# Patient Record
Sex: Male | Born: 1970 | Race: White | Hispanic: No | Marital: Single | Smoking: Current every day smoker
Health system: Southern US, Community
[De-identification: ages and names within clinical notes are randomized; demographics above are authoritative.]

## PROBLEM LIST (undated history)

## (undated) DIAGNOSIS — K279 Peptic ulcer, site unspecified, unspecified as acute or chronic, without hemorrhage or perforation: Secondary | ICD-10-CM

## (undated) HISTORY — PX: APPENDECTOMY: SHX54

## (undated) HISTORY — PX: UPPER GASTROINTESTINAL ENDOSCOPY: SHX188

## (undated) HISTORY — DX: Peptic ulcer, site unspecified, unspecified as acute or chronic, without hemorrhage or perforation: K27.9

---

## 2004-12-11 ENCOUNTER — Emergency Department: Payer: Self-pay | Admitting: Emergency Medicine

## 2004-12-22 ENCOUNTER — Emergency Department: Payer: Self-pay | Admitting: Emergency Medicine

## 2005-10-22 ENCOUNTER — Emergency Department: Payer: Self-pay | Admitting: Internal Medicine

## 2005-10-28 ENCOUNTER — Emergency Department: Payer: Self-pay | Admitting: Unknown Physician Specialty

## 2005-11-08 ENCOUNTER — Emergency Department: Payer: Self-pay | Admitting: Emergency Medicine

## 2012-02-02 ENCOUNTER — Emergency Department: Payer: Self-pay | Admitting: Emergency Medicine

## 2012-02-02 LAB — URINALYSIS, COMPLETE
Bacteria: NONE SEEN
Bilirubin,UR: NEGATIVE
Glucose,UR: NEGATIVE mg/dL (ref 0–75)
Ketone: NEGATIVE
Leukocyte Esterase: NEGATIVE
Nitrite: NEGATIVE
Ph: 5 (ref 4.5–8.0)
RBC,UR: 4 /HPF (ref 0–5)
Squamous Epithelial: NONE SEEN

## 2012-02-02 LAB — CBC
HCT: 46 % (ref 40.0–52.0)
HGB: 15.8 g/dL (ref 13.0–18.0)
MCH: 31.9 pg (ref 26.0–34.0)
MCHC: 34.2 g/dL (ref 32.0–36.0)
MCV: 93 fL (ref 80–100)
Platelet: 229 10*3/uL (ref 150–440)
RDW: 13.7 % (ref 11.5–14.5)
WBC: 7.8 10*3/uL (ref 3.8–10.6)

## 2012-02-02 LAB — DRUG SCREEN, URINE
Barbiturates, Ur Screen: NEGATIVE (ref ?–200)
Benzodiazepine, Ur Scrn: NEGATIVE (ref ?–200)
Cannabinoid 50 Ng, Ur ~~LOC~~: POSITIVE (ref ?–50)
Methadone, Ur Screen: NEGATIVE (ref ?–300)
Opiate, Ur Screen: NEGATIVE (ref ?–300)
Phencyclidine (PCP) Ur S: NEGATIVE (ref ?–25)
Tricyclic, Ur Screen: NEGATIVE (ref ?–1000)

## 2012-02-02 LAB — COMPREHENSIVE METABOLIC PANEL
Albumin: 4.9 g/dL (ref 3.4–5.0)
Alkaline Phosphatase: 75 U/L (ref 50–136)
BUN: 20 mg/dL — ABNORMAL HIGH (ref 7–18)
Bilirubin,Total: 0.9 mg/dL (ref 0.2–1.0)
Chloride: 106 mmol/L (ref 98–107)
Co2: 30 mmol/L (ref 21–32)
Creatinine: 1.19 mg/dL (ref 0.60–1.30)
EGFR (African American): >60
EGFR (Non-African Amer.): >60
Glucose: 85 mg/dL (ref 65–99)
Osmolality: 283 (ref 275–301)
SGOT(AST): 24 U/L (ref 15–37)
SGPT (ALT): 31 U/L
Total Protein: 8.3 g/dL — ABNORMAL HIGH (ref 6.4–8.2)

## 2012-02-02 LAB — TSH: Thyroid Stimulating Horm: 0.92 u[IU]/mL

## 2012-02-02 LAB — ETHANOL: Ethanol: <3 mg/dL

## 2016-02-27 ENCOUNTER — Ambulatory Visit: Payer: Self-pay | Admitting: Family Medicine

## 2016-02-27 VITALS — BP 110/73 | HR 85 | Wt 137.0 lb

## 2016-02-27 DIAGNOSIS — M5412 Radiculopathy, cervical region: Secondary | ICD-10-CM

## 2016-02-27 DIAGNOSIS — Z833 Family history of diabetes mellitus: Secondary | ICD-10-CM

## 2016-02-27 DIAGNOSIS — K279 Peptic ulcer, site unspecified, unspecified as acute or chronic, without hemorrhage or perforation: Secondary | ICD-10-CM

## 2016-02-27 LAB — GLUCOSE, POCT (MANUAL RESULT ENTRY): POC Glucose: 110 mg/dl — AB (ref 70–99)

## 2016-02-27 MED ORDER — NAPROXEN 250 MG PO TABS: 500.0000 mg | ORAL_TABLET | Freq: Two times a day (BID) | ORAL | 3 refills | Status: DC

## 2016-02-27 MED ORDER — CYCLOBENZAPRINE HCL 10 MG PO TABS: 10.0000 mg | ORAL_TABLET | Freq: Three times a day (TID) | ORAL | 3 refills | Status: DC

## 2016-02-27 NOTE — Patient Instructions (Signed)
Follow up in 2 weeks Labs today 

## 2016-02-27 NOTE — Progress Notes (Signed)
   Subjective:    Patient ID: Tyrone Leon, male    DOB: 1971-03-13, 45 y.o.   MRN: [id number]  HPI Patient presents today with pain in neck, left shoulder, and upper back for the past 4-5 years. Pain rates usually 7-8 on a scale of 1-10. Patient grits his teeth due to pain.  Patient reports difficulty sleeping  due to pain.  Patient occasionally uses marijuana.   There are no active problems to display for this patient.     Medication List    as of 02/27/2016  6:47 PM   You have not been prescribed any medications.      Review of Systems  Constitutional: Negative.   Respiratory: Negative.   Cardiovascular: Negative.   Psychiatric/Behavioral: Negative.    Limited range of motion in neck- unable to rotate No scoliosis   Over 5/5 strength on right upper extremity Over 4 /5strength on left upper extremitiy  No leisons on throat   Objective:   Physical Exam  Constitutional: He is oriented to person, place, and time. He appears well-developed and well-nourished.  HENT:  Head: Normocephalic and atraumatic.  Eyes: Conjunctivae are normal.  Neck: No thyromegaly present.  Cardiovascular: Normal rate, regular rhythm and normal heart sounds.   Pulmonary/Chest: Effort normal and breath sounds normal.  Abdominal: Soft.  Lymphadenopathy:    He has no cervical adenopathy.  Neurological: He is alert and oriented to person, place, and time.  Skin: Skin is warm and dry.  Psychiatric: He has a normal mood and affect. His behavior is normal. Judgment and thought content normal.   BP 110/73   Pulse 85   Wt 137 lb (62.1 kg)         Assessment & Plan:  Possible radiculopathy Order: X ray of cervical vertebrae Visit diagnosis: cervical radiculopathy/ torticollis  Anti-inflammatory: naproxen and flexeril Follow up in 2 weeks Labs today: CBC, CMET, Lipid, TSH, PSA

## 2016-02-28 LAB — CBC WITH DIFFERENTIAL
BASOS: 1 %
Basophils Absolute: 0.1 10*3/uL (ref 0.0–0.2)
EOS (ABSOLUTE): 0.4 10*3/uL (ref 0.0–0.4)
Eos: 5 %
HEMOGLOBIN: 14.1 g/dL (ref 12.6–17.7)
Hematocrit: 41.5 % (ref 37.5–51.0)
IMMATURE GRANS (ABS): 0 10*3/uL (ref 0.0–0.1)
Immature Granulocytes: 0 %
Lymphocytes Absolute: 3.2 10*3/uL — ABNORMAL HIGH (ref 0.7–3.1)
Lymphs: 42 %
MCH: 30.8 pg (ref 26.6–33.0)
MCHC: 34 g/dL (ref 31.5–35.7)
MCV: 91 fL (ref 79–97)
Monocytes Absolute: 0.6 10*3/uL (ref 0.1–0.9)
Monocytes: 8 %
NEUTROS ABS: 3.3 10*3/uL (ref 1.4–7.0)
Neutrophils: 44 %
RBC: 4.58 x10E6/uL (ref 4.14–5.80)
RDW: 14 % (ref 12.3–15.4)
WBC: 7.7 10*3/uL (ref 3.4–10.8)

## 2016-02-28 LAB — COMPREHENSIVE METABOLIC PANEL
ALK PHOS: 64 IU/L (ref 39–117)
ALT: 10 IU/L (ref 0–44)
AST: 12 IU/L (ref 0–40)
Albumin/Globulin Ratio: 1.9 (ref 1.2–2.2)
Albumin: 4.8 g/dL (ref 3.5–5.5)
BUN/Creatinine Ratio: 20 (ref 9–20)
BUN: 21 mg/dL (ref 6–24)
Bilirubin Total: 0.4 mg/dL (ref 0.0–1.2)
CALCIUM: 9.4 mg/dL (ref 8.7–10.2)
CO2: 26 mmol/L (ref 18–29)
CREATININE: 1.04 mg/dL (ref 0.76–1.27)
Chloride: 99 mmol/L (ref 96–106)
GFR calc Af Amer: 101 mL/min/{1.73_m2} (ref >59)
GFR calc non Af Amer: 88 mL/min/{1.73_m2} (ref >59)
GLUCOSE: 95 mg/dL (ref 65–99)
Globulin, Total: 2.5 g/dL (ref 1.5–4.5)
Potassium: 4 mmol/L (ref 3.5–5.2)
Sodium: 143 mmol/L (ref 134–144)
Total Protein: 7.3 g/dL (ref 6.0–8.5)

## 2016-02-28 LAB — PSA: Prostate Specific Ag, Serum: 0.8 ng/mL (ref 0.0–4.0)

## 2016-02-28 LAB — LIPID PANEL
CHOLESTEROL TOTAL: 188 mg/dL (ref 100–199)
Chol/HDL Ratio: 4.7 ratio units (ref 0.0–5.0)
HDL: 40 mg/dL (ref >39)
LDL CALC: 101 mg/dL — AB (ref 0–99)
TRIGLYCERIDES: 235 mg/dL — AB (ref 0–149)
VLDL CHOLESTEROL CAL: 47 mg/dL — AB (ref 5–40)

## 2016-02-28 LAB — TSH: TSH: 0.648 u[IU]/mL (ref 0.450–4.500)

## 2016-03-05 ENCOUNTER — Ambulatory Visit
Admission: RE | Admit: 2016-03-05 | Discharge: 2016-03-05 | Disposition: A | Discharge status: Home / Self Care | Payer: Self-pay | Source: Ambulatory Visit | Attending: Urology | Admitting: Urology

## 2016-03-05 DIAGNOSIS — M5412 Radiculopathy, cervical region: Secondary | ICD-10-CM | POA: Insufficient documentation

## 2016-03-06 ENCOUNTER — Ambulatory Visit: Payer: Self-pay

## 2016-03-12 ENCOUNTER — Ambulatory Visit: Payer: Self-pay

## 2016-03-19 ENCOUNTER — Ambulatory Visit: Payer: Self-pay | Admitting: Nurse Practitioner

## 2016-03-19 VITALS — BP 113/77 | HR 75 | Wt 144.5 lb

## 2016-03-19 DIAGNOSIS — M436 Torticollis: Secondary | ICD-10-CM

## 2016-03-19 DIAGNOSIS — M2550 Pain in unspecified joint: Secondary | ICD-10-CM

## 2016-03-19 NOTE — Progress Notes (Unsigned)
C/o pain radiating from L shoulder to lower cervical spine,  Causes a tilt of head to L, describes as feeling as an ice pick.   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   X-ray from cervical spine was unremarkable, states he has not been able to work because of the pain.    Has gone from smoking 2 ppd to 1 can of spit tobacco per week.    Has very poor tolerance for prescription meds.  Flexeril causes prolonged sleep.   Exam:  In obvious pain, head tilted to left, limited mobility arms, R>L Pain with palpation at shoulder L arm unable to bring to shoulder level, extension, to approx 120 degrees, loss of grip strength.    Impression:   ? Bursitis   And pain, not likely c-spine related?    Will draw crp and look at RA panel  Will also refer to ortho for further eval and management  Will not prescribe any meds as he has such poor tolerance.  Will defer any prednisone.    Will defer any further imaging

## 2016-04-08 ENCOUNTER — Other Ambulatory Visit: Payer: Self-pay

## 2016-04-08 ENCOUNTER — Encounter: Payer: Self-pay | Admitting: Specialist

## 2016-04-08 ENCOUNTER — Ambulatory Visit: Payer: Self-pay | Admitting: Ophthalmology

## 2016-04-09 ENCOUNTER — Telehealth: Payer: Self-pay

## 2016-04-09 NOTE — Telephone Encounter (Signed)
Called pt to reschedule appt. Appts done.

## 2016-04-15 ENCOUNTER — Encounter: Payer: Self-pay | Admitting: Specialist

## 2016-04-22 ENCOUNTER — Other Ambulatory Visit: Payer: Self-pay

## 2016-04-22 ENCOUNTER — Ambulatory Visit: Payer: Self-pay | Admitting: Ophthalmology

## 2016-04-22 DIAGNOSIS — K279 Peptic ulcer, site unspecified, unspecified as acute or chronic, without hemorrhage or perforation: Secondary | ICD-10-CM

## 2016-04-23 LAB — C-REACTIVE PROTEIN: CRP: 0.6 mg/L (ref 0.0–4.9)

## 2016-04-23 LAB — RHEUMATOID FACTOR: Rhuematoid fact SerPl-aCnc: <10 IU/mL (ref 0.0–13.9)

## 2016-04-29 ENCOUNTER — Ambulatory Visit: Payer: Self-pay | Admitting: Internal Medicine

## 2016-05-07 ENCOUNTER — Ambulatory Visit: Payer: Self-pay | Admitting: Nurse Practitioner

## 2016-05-07 VITALS — BP 111/65 | HR 85 | Ht 70.0 in | Wt 152.0 lb

## 2016-05-07 DIAGNOSIS — R0981 Nasal congestion: Secondary | ICD-10-CM

## 2016-05-07 DIAGNOSIS — E782 Mixed hyperlipidemia: Secondary | ICD-10-CM

## 2016-05-07 DIAGNOSIS — J3489 Other specified disorders of nose and nasal sinuses: Secondary | ICD-10-CM

## 2016-05-07 DIAGNOSIS — R5383 Other fatigue: Secondary | ICD-10-CM

## 2016-05-07 MED ORDER — SALINE SPRAY 0.65 % NA SOLN: 2.0000 | NASAL | 11 refills | Status: DC | PRN

## 2016-05-07 MED ORDER — FLUTICASONE PROPIONATE 50 MCG/ACT NA SUSP: 2.0000 | Freq: Every day | NASAL | Status: AC

## 2016-05-07 NOTE — Progress Notes (Signed)
CHIEF COMPLAINT, NOSE IS ALWAYS STOPPED UP, CAN ONLY BREATH OUT OF HIS MOUTH  X 20 + YEARS, HAS NEVER TAKEN ANY PARTICULAR MEDS, UNKNOWN ETIOLOGY  HISTORY OF MULTIPLE BROKEN NOSE, HAS NOT HAD ANY NOSE SURGERIES     IMPRESSION/PLAN:  LIKELY DEVIATED SEPTUM, R/T MULTIPLE, UNREPAIRED NASAL FRACTURES  WILL IMPLEMENT FLUTICASONE AND SALINE NASAL SPRAYS TO TRY AND ALLOW SOME RELIEF  W[facility] RE[facility] FOR EVALUATION AND MANAGEMENT

## 2016-05-07 NOTE — Patient Instructions (Signed)
SEE ENT REFERRAL

## 2016-08-05 ENCOUNTER — Other Ambulatory Visit: Payer: Self-pay

## 2016-08-05 DIAGNOSIS — R5383 Other fatigue: Secondary | ICD-10-CM

## 2016-08-05 DIAGNOSIS — E782 Mixed hyperlipidemia: Secondary | ICD-10-CM

## 2016-08-06 LAB — CBC WITH DIFFERENTIAL/PLATELET
BASOS ABS: 0.1 10*3/uL (ref 0.0–0.2)
Basos: 2 %
EOS (ABSOLUTE): 0.3 10*3/uL (ref 0.0–0.4)
EOS: 5 %
HEMATOCRIT: 41.2 % (ref 37.5–51.0)
HEMOGLOBIN: 14.5 g/dL (ref 13.0–17.7)
IMMATURE GRANULOCYTES: 2 %
Immature Grans (Abs): 0.1 10*3/uL (ref 0.0–0.1)
LYMPHS: 49 %
Lymphocytes Absolute: 3 10*3/uL (ref 0.7–3.1)
MCH: 31.2 pg (ref 26.6–33.0)
MCHC: 35.2 g/dL (ref 31.5–35.7)
MCV: 89 fL (ref 79–97)
MONOCYTES: 9 %
Monocytes Absolute: 0.6 10*3/uL (ref 0.1–0.9)
Neutrophils Absolute: 2 10*3/uL (ref 1.4–7.0)
Neutrophils: 33 %
Platelets: 221 10*3/uL (ref 150–379)
RBC: 4.65 x10E6/uL (ref 4.14–5.80)
RDW: 14 % (ref 12.3–15.4)
WBC: 6 10*3/uL (ref 3.4–10.8)

## 2016-08-06 LAB — COMPREHENSIVE METABOLIC PANEL
ALBUMIN: 4.8 g/dL (ref 3.5–5.5)
ALK PHOS: 68 IU/L (ref 39–117)
ALT: 63 IU/L — ABNORMAL HIGH (ref 0–44)
AST: 71 IU/L — ABNORMAL HIGH (ref 0–40)
Albumin/Globulin Ratio: 1.7 (ref 1.2–2.2)
BUN / CREAT RATIO: 13 (ref 9–20)
BUN: 14 mg/dL (ref 6–24)
Bilirubin Total: 0.5 mg/dL (ref 0.0–1.2)
CALCIUM: 9.3 mg/dL (ref 8.7–10.2)
CO2: 23 mmol/L (ref 18–29)
CREATININE: 1.11 mg/dL (ref 0.76–1.27)
Chloride: 103 mmol/L (ref 96–106)
GFR calc Af Amer: 94 mL/min/{1.73_m2} (ref >59)
GFR, EST NON AFRICAN AMERICAN: 81 mL/min/{1.73_m2} (ref >59)
GLUCOSE: 90 mg/dL (ref 65–99)
Globulin, Total: 2.9 g/dL (ref 1.5–4.5)
Potassium: 5.3 mmol/L — ABNORMAL HIGH (ref 3.5–5.2)
Sodium: 145 mmol/L — ABNORMAL HIGH (ref 134–144)
Total Protein: 7.7 g/dL (ref 6.0–8.5)

## 2016-08-06 LAB — LIPID PANEL
CHOLESTEROL TOTAL: 244 mg/dL — AB (ref 100–199)
Chol/HDL Ratio: 4.4 ratio units (ref 0.0–5.0)
HDL: 55 mg/dL (ref >39)
LDL CALC: 155 mg/dL — AB (ref 0–99)
Triglycerides: 172 mg/dL — ABNORMAL HIGH (ref 0–149)
VLDL CHOLESTEROL CAL: 34 mg/dL (ref 5–40)

## 2016-08-06 LAB — TSH: TSH: 1.75 u[IU]/mL (ref 0.450–4.500)

## 2016-08-12 ENCOUNTER — Ambulatory Visit: Payer: Self-pay | Admitting: Internal Medicine

## 2016-08-21 ENCOUNTER — Other Ambulatory Visit: Payer: Self-pay | Admitting: Urology

## 2016-08-21 ENCOUNTER — Telehealth: Payer: Self-pay

## 2016-08-21 DIAGNOSIS — R748 Abnormal levels of other serum enzymes: Secondary | ICD-10-CM

## 2016-08-21 NOTE — Telephone Encounter (Signed)
-----   Message from Harle BattiestShannon A McGowan, PA-C sent at 08/20/2016 10:11 AM EST ----- Patient's liver enzymes are significantly elevated from 5 months ago.  Does he drink alcohol?  If so,  I suggest he abstain and recheck his LFT's in one months.  If he is not drinking alcohol,  I suggest an ultrasound of his liver.

## 2016-08-21 NOTE — Telephone Encounter (Signed)
Called pt and left message about the ultrasound. They should call him and make that appt if not he should call me back next week sometime.

## 2016-08-21 NOTE — Telephone Encounter (Signed)
-----   Message from Tyrone BattiestShannon A McGowan, PA-C sent at 08/21/2016  2:44 PM EST ----- I have put the orders in for an ultrasound.   ----- Message ----- From: Audelia HivesAmanda E Ector Laurel, CMA Sent: 08/21/2016  10:21 AM To: Tyrone BattiestShannon A McGowan, PA-C  Shannon, Pt stated he rarely drinks alcohol so I think the ultrasound is what you wanted to do next. If you could put that order in for me I will handle that for you. Pt does have an appt on 01/31 to see chaplin.  ----- Message ----- From: Tyrone BattiestShannon A McGowan, PA-C Sent: 08/20/2016  10:11 AM To: Audelia HivesAmanda E Kaelem Brach, CMA  Patient's liver enzymes are significantly elevated from 5 months ago.  Does he drink alcohol?  If so,  I suggest he abstain and recheck his LFT's in one months.  If he is not drinking alcohol,  I suggest an ultrasound of his liver.

## 2016-08-21 NOTE — Telephone Encounter (Signed)
Called pt to go over results. PT stated he does not drink alcohol, every now and then. So shannon suggested a ultrasound of liver. I will have her put that order in and call pt when I have sent the referral over. Pt verbalized understanding.

## 2016-08-28 ENCOUNTER — Ambulatory Visit
Admission: RE | Admit: 2016-08-28 | Discharge: 2016-08-28 | Disposition: A | Discharge status: Home / Self Care | Payer: Self-pay | Source: Ambulatory Visit | Attending: Urology | Admitting: Urology

## 2016-08-28 DIAGNOSIS — K824 Cholesterolosis of gallbladder: Secondary | ICD-10-CM | POA: Insufficient documentation

## 2016-08-28 DIAGNOSIS — R748 Abnormal levels of other serum enzymes: Secondary | ICD-10-CM | POA: Insufficient documentation

## 2016-09-02 ENCOUNTER — Encounter: Payer: Self-pay | Admitting: Internal Medicine

## 2016-09-02 ENCOUNTER — Ambulatory Visit: Payer: Self-pay | Admitting: Internal Medicine

## 2016-09-02 VITALS — BP 114/76 | HR 79 | Temp 98.1°F | Wt 163.0 lb

## 2016-09-02 DIAGNOSIS — R748 Abnormal levels of other serum enzymes: Secondary | ICD-10-CM

## 2016-09-02 DIAGNOSIS — Z833 Family history of diabetes mellitus: Secondary | ICD-10-CM

## 2016-09-02 LAB — GLUCOSE, POCT (MANUAL RESULT ENTRY): POC Glucose: 118 mg/dl — AB (ref 70–99)

## 2016-09-02 NOTE — Progress Notes (Signed)
   Subjective:    Patient ID: Tyrone Leon, male    DOB: 03/05/1971, 46 y.o.   MRN: [id number]  HPI   Pt f/u for US Abdomen result for elevated liver enzymes.  Pt complains of constant shoulder pain.   Patient Active Problem List   Diagnosis Date Noted  . PUD (peptic ulcer disease)    Allergies as of 09/02/2016   Not on File     Medication List       Accurate as of 09/02/16  9:19 AM. Always use your most recent med list.          cyclobenzaprine 10 MG tablet Commonly known as:  FLEXERIL Take 1 tablet (10 mg total) by mouth 3 (three) times daily.   naproxen 250 MG tablet Commonly known as:  NAPROSYN Take 2 tablets (500 mg total) by mouth 2 (two) times daily with a meal.         Review of Systems  US Abdomen results are normal. Recommended f/u in 5 years.    Objective:   Physical Exam  Constitutional: He is oriented to person, place, and time.  Cardiovascular: Normal rate, regular rhythm and normal heart sounds.   Pulmonary/Chest: Effort normal and breath sounds normal.  Neurological: He is alert and oriented to person, place, and time.    BP 114/76   Pulse 79   Temp 98.1 F (36.7 C) (Oral)   Wt 163 lb (73.9 kg)   BMI 23.39 kg/m        Assessment & Plan:   Labs today: Hep C screen, liver panel Print copy of labs and ultrasound report

## 2016-09-02 NOTE — Patient Instructions (Addendum)
Labs today  [facility]

## 2016-09-03 LAB — HEPATIC FUNCTION PANEL
ALK PHOS: 68 IU/L (ref 39–117)
ALT: 13 IU/L (ref 0–44)
AST: 14 IU/L (ref 0–40)
Albumin: 4.8 g/dL (ref 3.5–5.5)
BILIRUBIN TOTAL: 0.5 mg/dL (ref 0.0–1.2)
BILIRUBIN, DIRECT: 0.12 mg/dL (ref 0.00–0.40)
Total Protein: 7.3 g/dL (ref 6.0–8.5)

## 2016-09-03 LAB — HEPATITIS C ANTIBODY: HEP C VIRUS AB: 0.1 {s_co_ratio} (ref 0.0–0.9)

## 2016-09-17 ENCOUNTER — Telehealth: Payer: Self-pay

## 2016-09-17 NOTE — Telephone Encounter (Signed)
Called pt with results. PT verbalized understanding. 

## 2016-10-29 ENCOUNTER — Ambulatory Visit: Payer: Self-pay | Admitting: Adult Health Nurse Practitioner

## 2016-10-29 VITALS — BP 108/69 | HR 88 | Temp 98.8°F | Wt 159.4 lb

## 2016-10-29 DIAGNOSIS — K279 Peptic ulcer, site unspecified, unspecified as acute or chronic, without hemorrhage or perforation: Secondary | ICD-10-CM

## 2016-10-29 DIAGNOSIS — K633 Ulcer of intestine: Secondary | ICD-10-CM

## 2016-10-29 MED ORDER — PANTOPRAZOLE SODIUM 40 MG PO TBEC: 40.0000 mg | DELAYED_RELEASE_TABLET | Freq: Every day | ORAL | 1 refills | Status: AC

## 2016-10-29 NOTE — Progress Notes (Signed)
  Patient: Tyrone Leon Male    DOB: 05/31/1971   46 y.o.   MRN: [id number] Visit Date: 10/29/2016  Today's Provider: Jacelyn Pieah Doles-Johnson, NP   Chief Complaint  Patient presents with  . GI Problem    stomach ulcers, sharp pain upper stomach,  . Diarrhea    consistent for 6 months, change in color  . Joint Pain    full body joint pain   Subjective:    HPI  Pt states that he feels like his ulcers have been "acting up" over the past few months.  Abdominal pain, sharp cramps green or black BMs- no noted blood. Loose stool.  Pt states that they were found using endoscopy.  Not on any medications at this time.  States that he has been treating with milk.  Denies nausea or vomiting.     Not on File Previous Medications   CYCLOBENZAPRINE (FLEXERIL) 10 MG TABLET    Take 1 tablet (10 mg total) by mouth 3 (three) times daily.   NAPROXEN (NAPROSYN) 250 MG TABLET    Take 2 tablets (500 mg total) by mouth 2 (two) times daily with a meal.    Review of Systems  All other systems reviewed and are negative.   Social History  Substance Use Topics  . Smoking status: Current Every Day Smoker    Packs/day: 1.00    Years: 25.00    Types: Cigarettes    Last attempt to quit: 2017  . Smokeless tobacco: Current User    Types: Chew     Comment: quit cigarettes about 8 mo ago   . Alcohol use 1.8 - 2.4 oz/week    3 - 4 Cans of beer per week     Comment: 3-4 beers per year   Objective:   BP 108/69   Pulse 88   Temp 98.8 F (37.1 C)   Wt 159 lb 6.4 oz (72.3 kg)   BMI 22.87 kg/m   Physical Exam  Constitutional: He is oriented to person, place, and time. He appears well-developed and well-nourished.  HENT:  Head: Normocephalic and atraumatic.  Cardiovascular: Normal rate, regular rhythm and normal heart sounds.   Pulmonary/Chest: Effort normal and breath sounds normal.  Abdominal: Soft. Bowel sounds are normal. He exhibits no distension and no mass. There is tenderness in the epigastric  area. There is no rebound and no guarding.  Neurological: He is alert and oriented to person, place, and time.  Skin: Skin is warm and dry.  Vitals reviewed.       Assessment & Plan:         Ulcer:  H. Pylori testing.  Stool occult blood testing.  Start Pantoprazole 40mg  daily.  Avoid triggers.  Check CBC for stability.     Jacelyn Pieah Doles-Johnson, NP   [facility]

## 2016-10-29 NOTE — Addendum Note (Signed)
Addended by: Dustin FlockMAYNOR, Brittny Spangle D on: 10/29/2016 07:03 PM   Modules accepted: Orders

## 2016-10-29 NOTE — Addendum Note (Signed)
Addended by: Charlene BrookeLES-JOHNSON, Amyre Segundo N on: 10/29/2016 07:06 PM   Modules accepted: Orders

## 2016-10-29 NOTE — Addendum Note (Signed)
Addended by: Dustin FlockMAYNOR, Neveah Bang D on: 10/29/2016 06:50 PM   Modules accepted: Orders

## 2016-10-30 LAB — CBC
HEMATOCRIT: 42 % (ref 37.5–51.0)
Hemoglobin: 14.3 g/dL (ref 13.0–17.7)
MCH: 31.2 pg (ref 26.6–33.0)
MCHC: 34 g/dL (ref 31.5–35.7)
MCV: 92 fL (ref 79–97)
Platelets: 263 10*3/uL (ref 150–379)
RBC: 4.58 x10E6/uL (ref 4.14–5.80)
RDW: 13.8 % (ref 12.3–15.4)
WBC: 8.1 10*3/uL (ref 3.4–10.8)

## 2016-11-01 LAB — H. PYLORI BREATH TEST

## 2016-11-01 LAB — H.PYLORI BREATH TEST (REFLEX): H. PYLORI BREATH TEST: NEGATIVE

## 2016-12-23 ENCOUNTER — Ambulatory Visit: Payer: Self-pay | Admitting: Internal Medicine

## 2016-12-23 VITALS — BP 99/63 | HR 80 | Temp 97.9°F | Wt 161.2 lb

## 2016-12-23 DIAGNOSIS — F17210 Nicotine dependence, cigarettes, uncomplicated: Secondary | ICD-10-CM

## 2016-12-23 NOTE — Patient Instructions (Signed)
Labs today Referral for chiropractic evaluation for neck and back pain [facility] for [facility]

## 2016-12-23 NOTE — Progress Notes (Signed)
   Subjective:    Patient ID: Tyrone Leon, male    DOB: 07/22/1971, 46 y.o.   MRN: [id number]  HPI   Pt here for regular apt  Pt would like to quit smoking.  Pt reports chronic back and neck pain. Pt has seen a rheumatologist and neurologist for pain and has had xrays with no relief.   Patient Active Problem List   Diagnosis Date Noted  . PUD (peptic ulcer disease)    Allergies as of 12/23/2016   Not on File     Medication List       Accurate as of 12/23/16 11:33 AM. Always use your most recent med list.          cyclobenzaprine 10 MG tablet Commonly known as:  FLEXERIL Take 1 tablet (10 mg total) by mouth 3 (three) times daily.   naproxen 250 MG tablet Commonly known as:  NAPROSYN Take 2 tablets (500 mg total) by mouth 2 (two) times daily with a meal.   pantoprazole 40 MG tablet Commonly known as:  PROTONIX Take 1 tablet (40 mg total) by mouth daily.        Review of Systems     Objective:   Physical Exam  Constitutional: He is oriented to person, place, and time.  Cardiovascular: Normal rate, regular rhythm and normal heart sounds.   Pulmonary/Chest: Effort normal and breath sounds normal.  Neurological: He is alert and oriented to person, place, and time.    BP 99/63   Pulse 80   Temp 97.9 F (36.6 C)   Wt 161 lb 3.2 oz (73.1 kg)   BMI 23.13 kg/m   Smoking cessation counseling given to pt.      Assessment & Plan:   Labs today: Met C, CBC, UA, Lipid, TSH, A1C Referral for chiropractic evaluation for neck and back pain Referral to [facility] for smoking cessation class   

## 2016-12-24 LAB — LIPID PANEL
Chol/HDL Ratio: 4.5 ratio (ref 0.0–5.0)
Cholesterol, Total: 180 mg/dL (ref 100–199)
HDL: 40 mg/dL (ref >39)
LDL Calculated: 117 mg/dL — ABNORMAL HIGH (ref 0–99)
Triglycerides: 116 mg/dL (ref 0–149)
VLDL Cholesterol Cal: 23 mg/dL (ref 5–40)

## 2016-12-24 LAB — COMPREHENSIVE METABOLIC PANEL
ALBUMIN: 4.7 g/dL (ref 3.5–5.5)
ALT: 11 IU/L (ref 0–44)
AST: 17 IU/L (ref 0–40)
Albumin/Globulin Ratio: 1.9 (ref 1.2–2.2)
Alkaline Phosphatase: 65 IU/L (ref 39–117)
BUN / CREAT RATIO: 11 (ref 9–20)
BUN: 11 mg/dL (ref 6–24)
Bilirubin Total: 0.4 mg/dL (ref 0.0–1.2)
CALCIUM: 9.2 mg/dL (ref 8.7–10.2)
CO2: 26 mmol/L (ref 18–29)
CREATININE: 1.03 mg/dL (ref 0.76–1.27)
Chloride: 103 mmol/L (ref 96–106)
GFR, EST AFRICAN AMERICAN: 102 mL/min/{1.73_m2} (ref >59)
GFR, EST NON AFRICAN AMERICAN: 88 mL/min/{1.73_m2} (ref >59)
GLOBULIN, TOTAL: 2.5 g/dL (ref 1.5–4.5)
Glucose: 90 mg/dL (ref 65–99)
Potassium: 4 mmol/L (ref 3.5–5.2)
SODIUM: 141 mmol/L (ref 134–144)
TOTAL PROTEIN: 7.2 g/dL (ref 6.0–8.5)

## 2016-12-24 LAB — URINALYSIS
BILIRUBIN UA: NEGATIVE
Glucose, UA: NEGATIVE
Ketones, UA: NEGATIVE
Leukocytes, UA: NEGATIVE
Nitrite, UA: NEGATIVE
PROTEIN UA: NEGATIVE
RBC, UA: NEGATIVE
Specific Gravity, UA: 1.007 (ref 1.005–1.030)
Urobilinogen, Ur: 0.2 mg/dL (ref 0.2–1.0)
pH, UA: 5.5 (ref 5.0–7.5)

## 2016-12-24 LAB — HEMOGLOBIN A1C
Est. average glucose Bld gHb Est-mCnc: 114 mg/dL
HEMOGLOBIN A1C: 5.6 % (ref 4.8–5.6)

## 2016-12-24 LAB — CBC
HEMATOCRIT: 43.7 % (ref 37.5–51.0)
Hemoglobin: 14.7 g/dL (ref 13.0–17.7)
MCH: 30.8 pg (ref 26.6–33.0)
MCHC: 33.6 g/dL (ref 31.5–35.7)
MCV: 91 fL (ref 79–97)
Platelets: 246 10*3/uL (ref 150–379)
RBC: 4.78 x10E6/uL (ref 4.14–5.80)
RDW: 13.6 % (ref 12.3–15.4)
WBC: 5.8 10*3/uL (ref 3.4–10.8)

## 2016-12-24 LAB — TSH: TSH: 1.2 u[IU]/mL (ref 0.450–4.500)

## 2017-01-06 ENCOUNTER — Ambulatory Visit: Payer: Self-pay | Admitting: Chiropractor

## 2017-01-06 ENCOUNTER — Encounter: Payer: Self-pay | Admitting: Chiropractor

## 2017-01-06 DIAGNOSIS — M9901 Segmental and somatic dysfunction of cervical region: Secondary | ICD-10-CM

## 2017-01-06 DIAGNOSIS — M9902 Segmental and somatic dysfunction of thoracic region: Secondary | ICD-10-CM | POA: Insufficient documentation

## 2017-01-06 DIAGNOSIS — M9903 Segmental and somatic dysfunction of lumbar region: Secondary | ICD-10-CM | POA: Insufficient documentation

## 2017-01-06 DIAGNOSIS — M62838 Other muscle spasm: Secondary | ICD-10-CM | POA: Insufficient documentation

## 2017-01-06 NOTE — Progress Notes (Signed)
S: Patient presents with right upper back pain under the scapular. Patient states its been off and on for the last 13 years with little relief. Progressively getting worse. 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 Nothing makes it better. Position, exercise, and sleeping makes it worse. Pain is achy and dull, sometimes sharp. Pain radiates up into the neck causing neck pain. Pain is 8/10. Worse in the morning. Pain is constant. Patient only sleeps 2-4 hours each day due to pain. Headaches when pain is at its worst.   Patient also presents with low back pain. Pain is not constant but 3 times in the last 2 years he has had sudden sharp pain in his low back and paralysis from the waist down. Lasts about 45 minutes. First episode two years ago after shoveling. Next episode after picking up a 2lb box. Last episode after getting up from couch and turning. Patient has been seen by many specialists. MRIs and CTs have revealed nothing. MDs' best guess is something neurological. No loss of bowel or bladder control.   Patient's main goal is to get back in the gym working out.  O:  Posture - Left Csp lateral flexion, left high shoulder, right high hip, anterior head carriage.  ROM: Decreased AROM of cervical spine in all directions. Patients states mostly from stiffness. Lsp ROM WNL except for decreased extension with pain.  Segmental dysfunction: C2 L, C5 R, T3R, T5L, T9 R, L2 R, L5 R, SI R PI.   TTF: Csp B, Tsp B, Lsp R  Ortho: Positive max foraminal compression left, jacksons bilateral, distraction bilateral, yeomans bilateral, hibbs bilateral. Negative Lsp and Csp valsalva, SLR, braggards.  Neuro: Reflexes C5-T1 and L4-S1 2, Motor C5-T1 and L4-S1 5, Sensory C5-T1 and L4-S1 normal bilateral.  A: Patient's initial visit. Chronic phase, 1x EOW until next re-eval.  P: SMT at above stated levels. Patient received care without incident. Patient stated he felt much better after Tx. ROF completed  before Tx. PARNB.

## 2017-01-20 ENCOUNTER — Ambulatory Visit: Payer: Self-pay | Admitting: Chiropractor

## 2017-01-20 DIAGNOSIS — M9902 Segmental and somatic dysfunction of thoracic region: Secondary | ICD-10-CM

## 2017-01-20 DIAGNOSIS — M9901 Segmental and somatic dysfunction of cervical region: Secondary | ICD-10-CM

## 2017-01-20 DIAGNOSIS — M9903 Segmental and somatic dysfunction of lumbar region: Secondary | ICD-10-CM

## 2017-01-20 DIAGNOSIS — M62838 Other muscle spasm: Secondary | ICD-10-CM

## 2017-01-20 NOTE — Progress Notes (Signed)
S: Patient states he feels much better since LPV. Patient presents with right upper back pain under the scapular. Patient states its been off and on for the last 13 years with little relief. Progressively getting worse. 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 Nothing makes it better. Position, exercise, and sleeping makes it worse. Pain is achy and dull, sometimes sharp. Pain radiates up into the neck causing neck pain. Pain is 3/10. Worse in the morning. Pain is constant. Patient only sleeps 2-4 hours each day due to pain. Headaches when pain is at its worst. Patient states he was able to get back into the gym.  Patient also presents with low back pain. Pain is not constant but 3 times in the last 2 years he has had sudden sharp pain in his low back and paralysis from the waist down. Lasts about 45 minutes. First episode two years ago after shoveling. Next episode after picking up a 2lb box. Last episode after getting up from couch and turning. Patient has been seen by many specialists. MRIs and CTs have revealed nothing. MDs' best guess is something neurological. No loss of bowel or bladder control.    O:  Posture - Left Csp lateral flexion, left high shoulder, right high hip, anterior head carriage.  ROM: Decreased AROM of cervical spine in all directions. Patients states mostly from stiffness. Lsp ROM WNL except for decreased extension with pain.  Segmental dysfunction: C2 L, C5 R, T3R, T5L, T9 R, L2 R, L5 R, SI R PI.   TTF: Csp B, Tsp B, Lsp R  Ortho: Positive max foraminal compression left, jacksons bilateral, distraction bilateral, yeomans bilateral, hibbs bilateral. Negative Lsp and Csp valsalva, SLR, braggards.  Neuro: Reflexes C5-T1 and L4-S1 2, Motor C5-T1 and L4-S1 5, Sensory C5-T1 and L4-S1 normal bilateral.  A:  Chronic phase, 1x EOW until next re-eval.  P: SMT at above stated levels. Patient received care without incident. Patient stated he felt much better after Tx.

## 2017-02-17 ENCOUNTER — Ambulatory Visit: Payer: Self-pay | Admitting: Chiropractor

## 2017-02-17 DIAGNOSIS — M9902 Segmental and somatic dysfunction of thoracic region: Secondary | ICD-10-CM

## 2017-02-17 DIAGNOSIS — M9901 Segmental and somatic dysfunction of cervical region: Secondary | ICD-10-CM

## 2017-02-17 DIAGNOSIS — M62838 Other muscle spasm: Secondary | ICD-10-CM

## 2017-02-17 DIAGNOSIS — M9903 Segmental and somatic dysfunction of lumbar region: Secondary | ICD-10-CM

## 2017-02-17 NOTE — Progress Notes (Signed)
S: Patient states he feels much better since LPV, however his right upper back gave him some trouble Monday. Patient took aleve which helped. Patient presents with right upper back pain under the scapular. Patient states its been off and on for the last 13 years with little relief. Progressively getting worse. 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 Nothing makes it better. Position, exercise, and sleeping makes it worse. Pain is achy and dull, sometimes sharp. Pain radiates up into the neck causing neck pain. Pain is 3/10. Worse in the morning. Pain is constant. Patient only sleeps 2-4 hours each day due to pain. Headaches when pain is at its worst. Patient states he was able to get back into the gym.  Patient also presents with low back pain. Pain is not constant but 3 times in the last 2 years he has had sudden sharp pain in his low back and paralysis from the waist down. Lasts about 45 minutes. First episode two years ago after shoveling. Next episode after picking up a 2lb box. Last episode after getting up from couch and turning. Patient has been seen by many specialists. MRIs and CTs have revealed nothing. MDs' best guess is something neurological. No loss of bowel or bladder control.    O:  Posture - Left Csp lateral flexion, left high shoulder, right high hip, anterior head carriage.  ROM: Decreased AROM of cervical spine in all directions. Patients states mostly from stiffness. Lsp ROM WNL except for decreased extension with pain.  Segmental dysfunction: C2 L, C5 R, T3R, T5L, T9 R, L2 R, L5 R, SI R PI.   TTF: Csp B, Tsp B, Lsp R  Ortho: Positive max foraminal compression left, jacksons bilateral, distraction bilateral, yeomans bilateral, hibbs bilateral. Negative Lsp and Csp valsalva, SLR, braggards.  Neuro: Reflexes C5-T1 and L4-S1 2, Motor C5-T1 and L4-S1 5, Sensory C5-T1 and L4-S1 normal bilateral.  A:  Chronic phase, 1x EOW until next re-eval.  P: SMT at above stated  levels. Patient received care without incident. Patient stated he felt much better after Tx.

## 2017-03-17 ENCOUNTER — Ambulatory Visit: Payer: Self-pay | Admitting: Chiropractor

## 2017-03-17 DIAGNOSIS — M9903 Segmental and somatic dysfunction of lumbar region: Secondary | ICD-10-CM

## 2017-03-17 DIAGNOSIS — M9902 Segmental and somatic dysfunction of thoracic region: Secondary | ICD-10-CM

## 2017-03-17 DIAGNOSIS — M62838 Other muscle spasm: Secondary | ICD-10-CM

## 2017-03-17 DIAGNOSIS — M9901 Segmental and somatic dysfunction of cervical region: Secondary | ICD-10-CM

## 2017-03-17 NOTE — Progress Notes (Signed)
S: Patient states he has felt a lot worse in the last 48 hours. Woke up with right lower back pain two mornings ago and felt his neck get very stiff last night. Patient presents with right upper back pain under the scapular. Patient states its been off and on for the last 13 years with little relief. Progressively getting worse. 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 Nothing makes it better. Position, exercise, and sleeping makes it worse. Pain is achy and dull, sometimes sharp. Pain radiates up into the neck causing neck pain. Pain is 6/10. Worse in the morning. Pain is constant. Patient only sleeps 2-4 hours each day due to pain. Headaches when pain is at its worst. Patient states he was able to get back into the gym.  Patient also presents with low back pain. Pain is not constant but 3 times in the last 2 years he has had sudden sharp pain in his low back and paralysis from the waist down. Lasts about 45 minutes. First episode two years ago after shoveling. Next episode after picking up a 2lb box. Last episode after getting up from couch and turning. Patient has been seen by many specialists. MRIs and CTs have revealed nothing. MDs' best guess is something neurological. No loss of bowel or bladder control.    O:  Posture - Left Csp lateral flexion, left high shoulder, right high hip, anterior head carriage.  ROM: Decreased AROM of cervical spine in all directions. Patients states mostly from stiffness. Lsp ROM WNL except for decreased extension with pain.  Segmental dysfunction: C2 L, C5 R, T3R, T5L, T9 R, L2 R, L5 R, SI R PI.   TTF: Csp B, Tsp B, Lsp R  Ortho: Positive max foraminal compression left, jacksons bilateral, distraction bilateral, yeomans bilateral, hibbs bilateral. Negative Lsp and Csp valsalva, SLR, braggards.  Neuro: Reflexes C5-T1 and L4-S1 2, Motor C5-T1 and L4-S1 5, Sensory C5-T1 and L4-S1 normal bilateral.  A:  Chronic phase, 1x EOW until next re-eval.  P: SMT  at above stated levels. Patient received care without incident. Patient stated he felt much better after Tx.

## 2017-03-31 ENCOUNTER — Ambulatory Visit: Payer: Self-pay | Admitting: Chiropractor

## 2017-03-31 DIAGNOSIS — M62838 Other muscle spasm: Secondary | ICD-10-CM

## 2017-03-31 DIAGNOSIS — M9902 Segmental and somatic dysfunction of thoracic region: Secondary | ICD-10-CM

## 2017-03-31 DIAGNOSIS — M9903 Segmental and somatic dysfunction of lumbar region: Secondary | ICD-10-CM

## 2017-03-31 DIAGNOSIS — M9901 Segmental and somatic dysfunction of cervical region: Secondary | ICD-10-CM

## 2017-03-31 NOTE — Progress Notes (Signed)
S: Patient states he has felt a lot better since LPV. Patient states he is in the gym every day working out, could potential cause his recent tightness. Patient presents with right upper back pain under the scapula. Patient states its been off and on for the last 13 years with little relief. 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 Nothing makes it better. Position, exercise, and sleeping makes it worse. Pain is achy and dull, sometimes sharp. Pain radiates up into the neck causing neck pain. Pain is 4/10. Worse in the morning. Pain is constant. Patient only sleeps 2-4 hours each day due to pain. Headaches when pain is at its worst. Patient states he was able to get back into the gym.  Patient also presents with low back pain. Pain is not constant but 3 times in the last 2 years he has had sudden sharp pain in his low back and paralysis from the waist down. Lasts about 45 minutes. First episode two years ago after shoveling. Next episode after picking up a 2lb box. Last episode after getting up from couch and turning. Patient has been seen by many specialists. MRIs and CTs have revealed nothing. MDs' best guess is something neurological. No loss of bowel or bladder control.    O:  Posture - Left Csp lateral flexion, left high shoulder, right high hip, anterior head carriage.  ROM: Decreased AROM of cervical spine in all directions. Patients states mostly from stiffness. Lsp ROM WNL except for decreased extension with pain.  Segmental dysfunction: C2 L, C5 R, T3R, T5L, T9 R, L2 R, L5 R, SI R PI.   TTF: Csp B, Tsp B, Lsp R  Ortho: Positive max foraminal compression left, jacksons bilateral, distraction bilateral, yeomans bilateral, hibbs bilateral. Negative Lsp and Csp valsalva, SLR, braggards.  Neuro: Reflexes C5-T1 and L4-S1 2, Motor C5-T1 and L4-S1 5, Sensory C5-T1 and L4-S1 normal bilateral.  A:  Chronic phase, 1x EOW until next re-eval.  P: SMT at above stated levels. Patient  received care without incident. Patient stated he felt much better after Tx.

## 2017-04-14 ENCOUNTER — Ambulatory Visit: Payer: Self-pay | Admitting: Chiropractor

## 2017-05-09 IMAGING — CR DG CERVICAL SPINE COMPLETE 4+V
6 series · 6 of 6 positions shown · non-contrast
Comparison: None.

CLINICAL DATA: Pt states neck stiffness with pain in his left upper
shoulder that goes up into his neck. Pt states pain with moving neck
in any direction. Pt states pain for 5yrs. No known injury. No
previous surgeries

EXAM:
CERVICAL SPINE - COMPLETE 4+ VIEW

[c-spine lat]
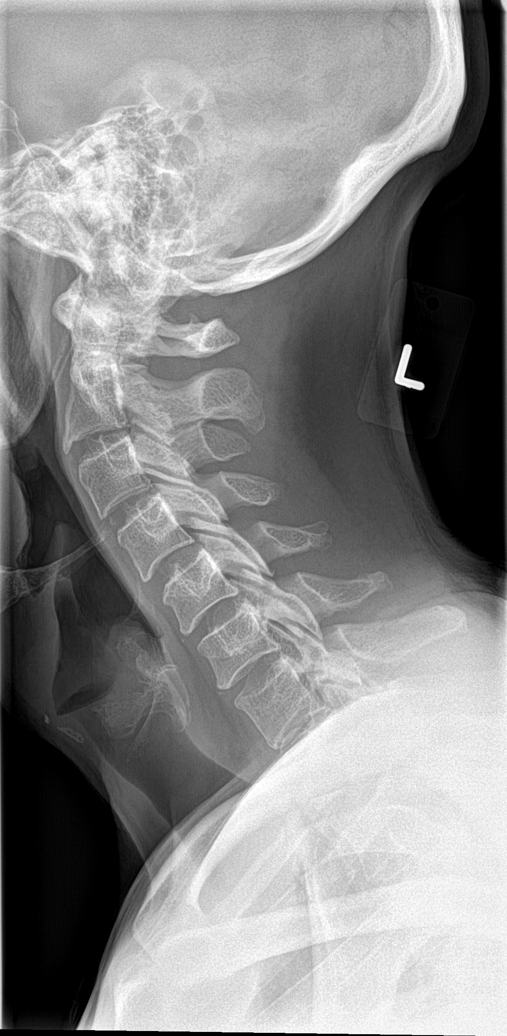

[c-spine obl (1 of 2)]
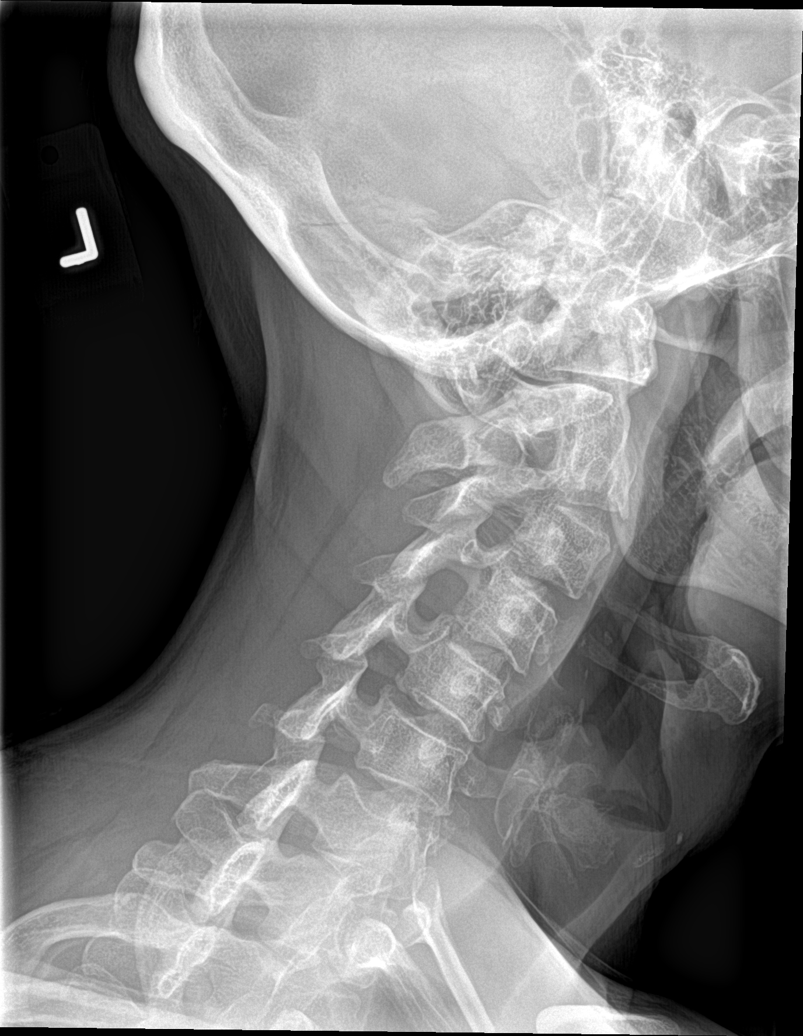

[c-spine obl (2 of 2)]
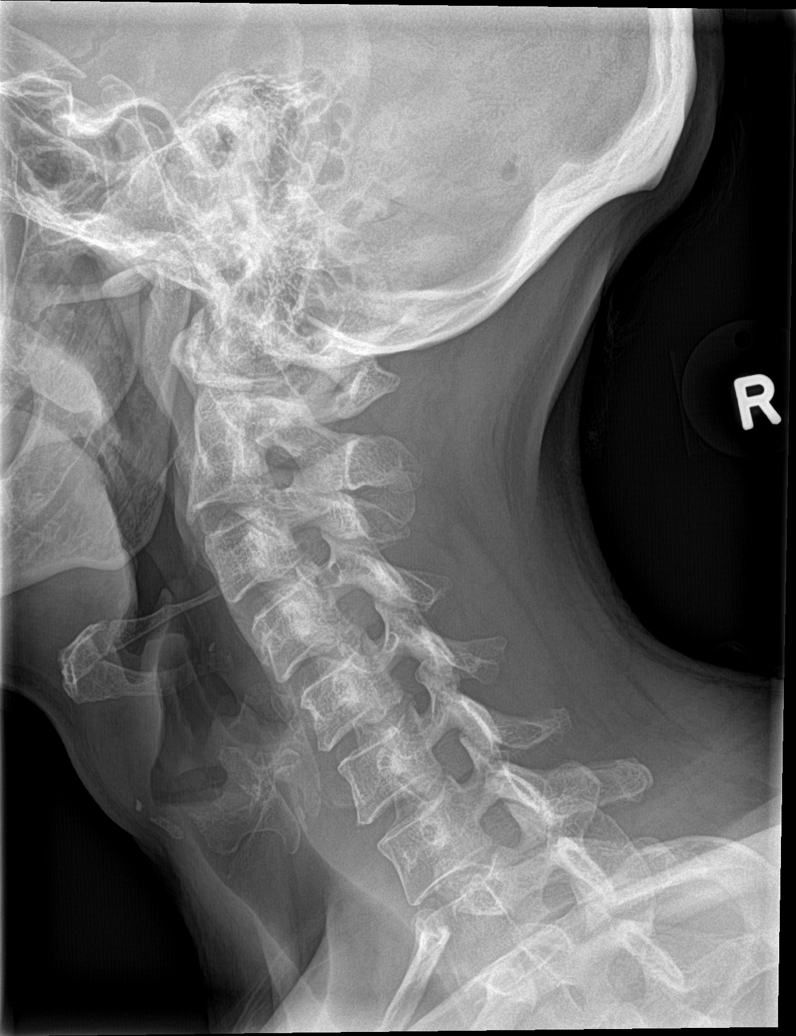

[c-spine ap]
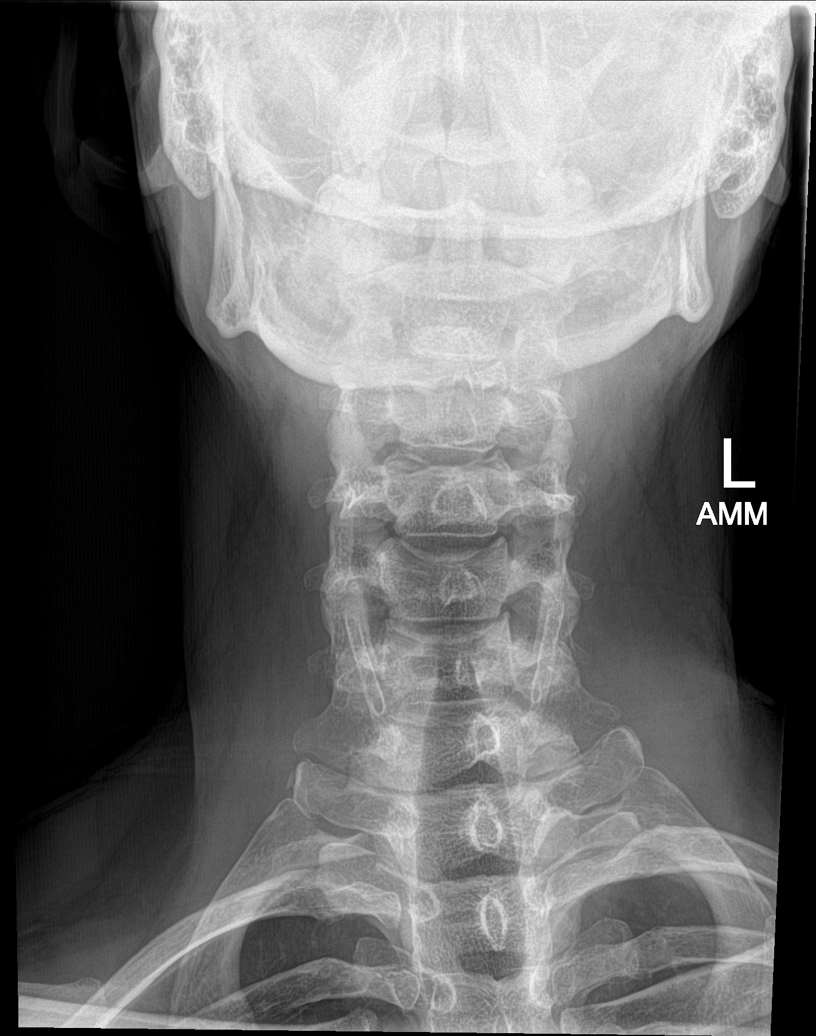

[c-spine open mouth]
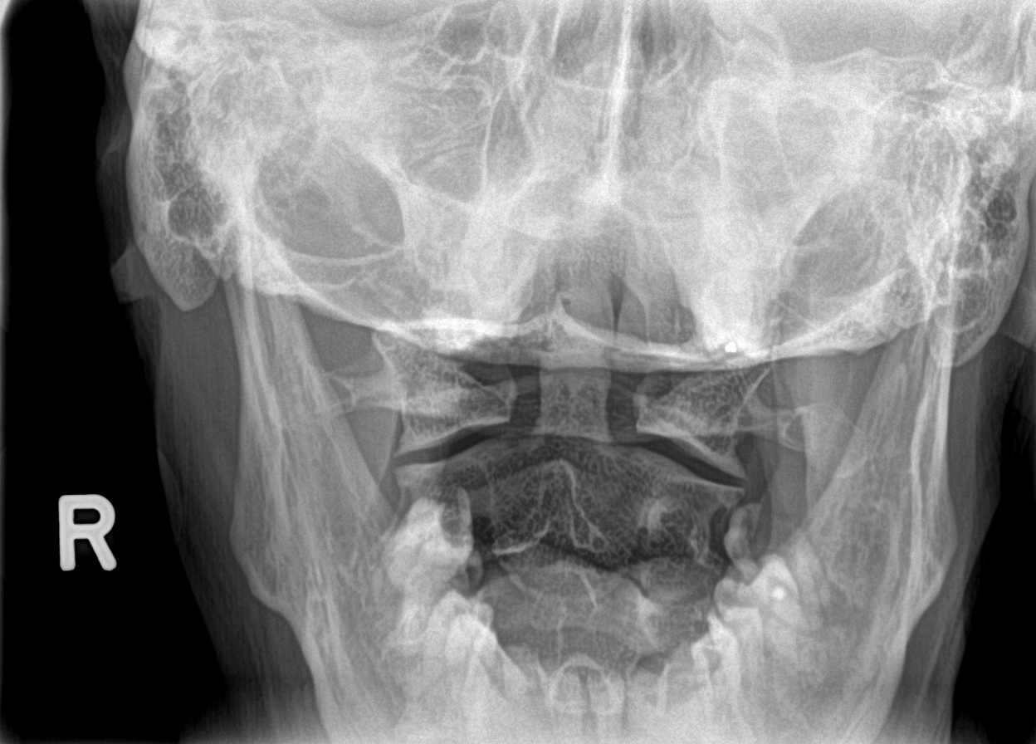

[[person_name]]
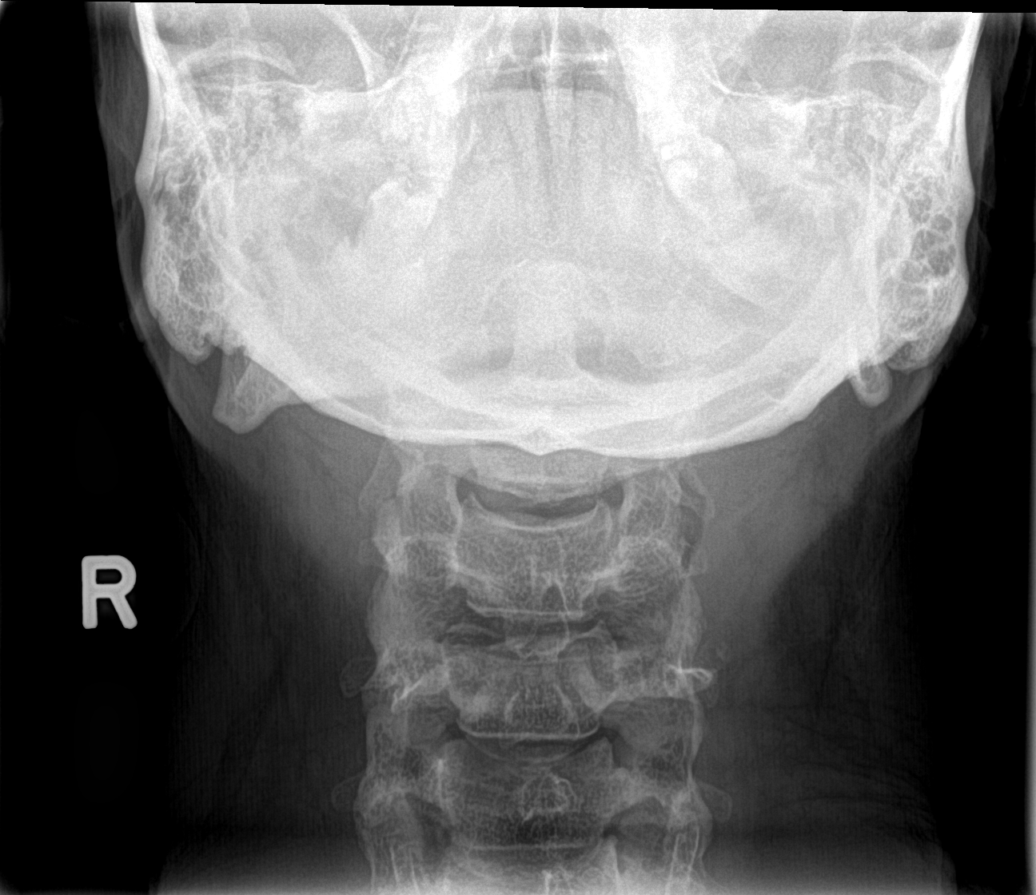

[6 of 6 positions shown; findings below may reference images not displayed]

FINDINGS: There is no evidence of cervical spine fracture or prevertebral soft
tissue swelling. Alignment is normal. No other significant bone
abnormalities are identified.
IMPRESSION: Negative cervical spine radiographs.

## 2017-06-23 ENCOUNTER — Telehealth: Payer: Self-pay | Admitting: Pharmacy Technician

## 2017-06-23 NOTE — Telephone Encounter (Signed)
Patient failed to provide current poi for 2018.  No additional medication assistance will be provided by [facility] without the required proof of income documentation.  Patient notified by letter.  Nefertari Rebman J. Ayaka Andes Care Manager [facility] 

## 2018-04-28 ENCOUNTER — Telehealth: Payer: Self-pay | Admitting: Adult Health Nurse Practitioner

## 2018-04-28 NOTE — Telephone Encounter (Signed)
Pt wants a call back. Checked his documentation and he needs to update elig.

## 2018-04-29 NOTE — Telephone Encounter (Signed)
Left message for patient to call [facility].

## 2018-05-03 ENCOUNTER — Telehealth: Payer: Self-pay | Admitting: Licensed Clinical Social Worker

## 2018-05-03 NOTE — Telephone Encounter (Signed)
Clinician spoke with care coordinator regarding the patient who presented on Friday to the clinic for a flu shot but was extremely upset and asked to speak to someone in private. The care coordinator administered a PHQ 9 to the patient and he scored a 27. She explained the social worker would be back in the clinic on Tuesday and would reach out to him to talk with him about scheduling a mental health assessment with her.    Clinician attempted to speak with patient, left voicemail with contact information.

## 2018-05-30 IMAGING — US US ABDOMEN COMPLETE
1 series · 13 of 25 positions shown · non-contrast
Comparison: None.

CLINICAL DATA: Elevated liver function tests

EXAM:
ABDOMEN ULTRASOUND COMPLETE

[Series 1: us abdomen complete · 0.17mm/px · 13 of 87 slices shown]
[im 1/87]
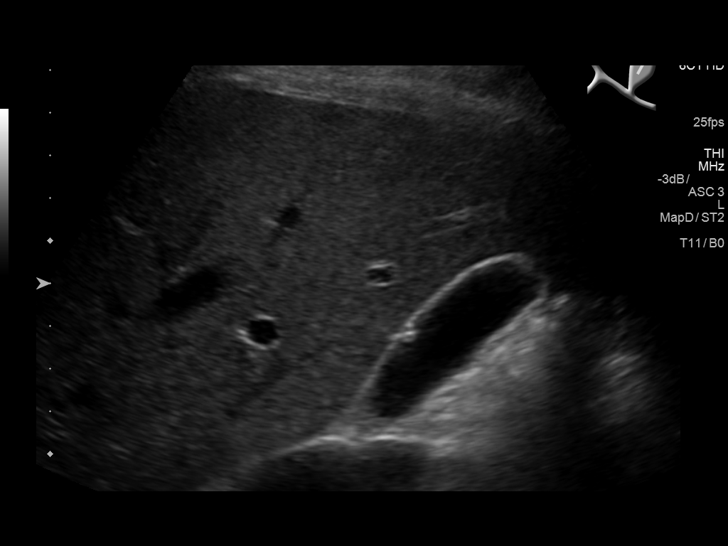
[im 8/87]
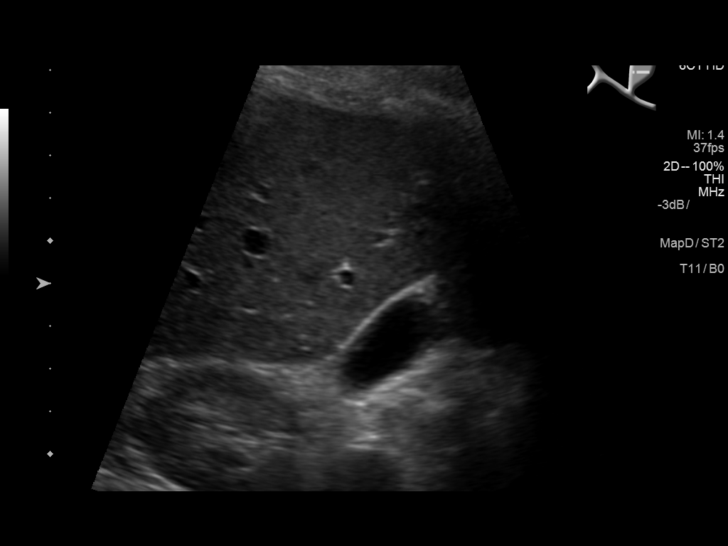
[im 15/87]
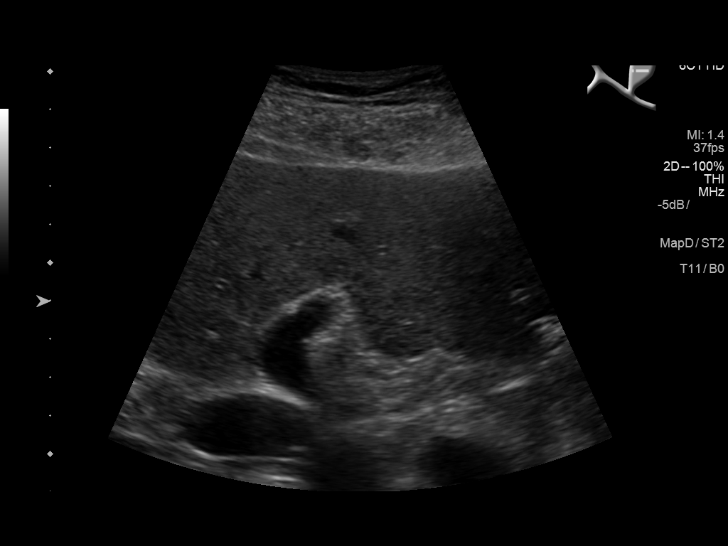
[im 22/87]
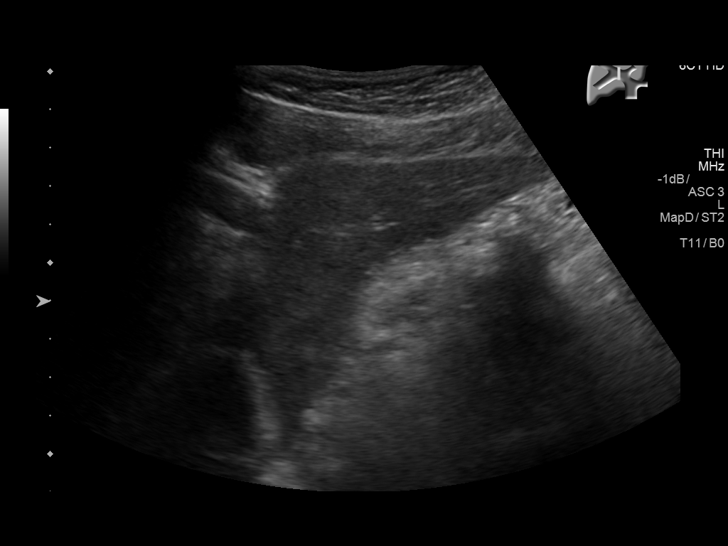
[im 29/87]
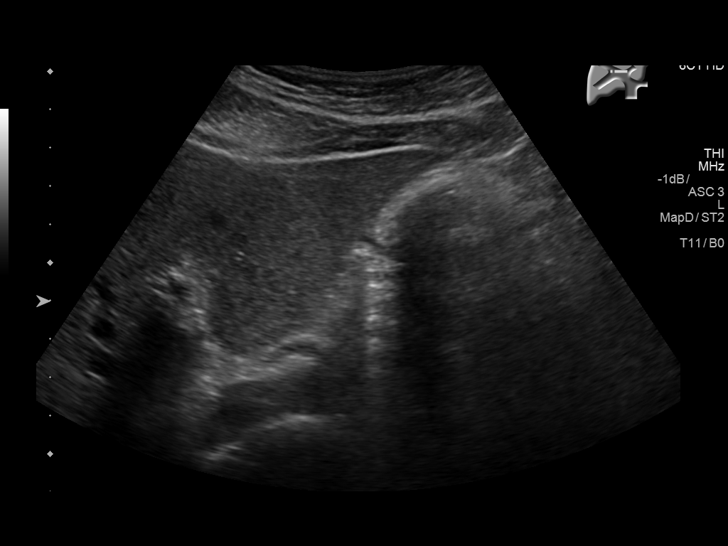
[im 36/87]
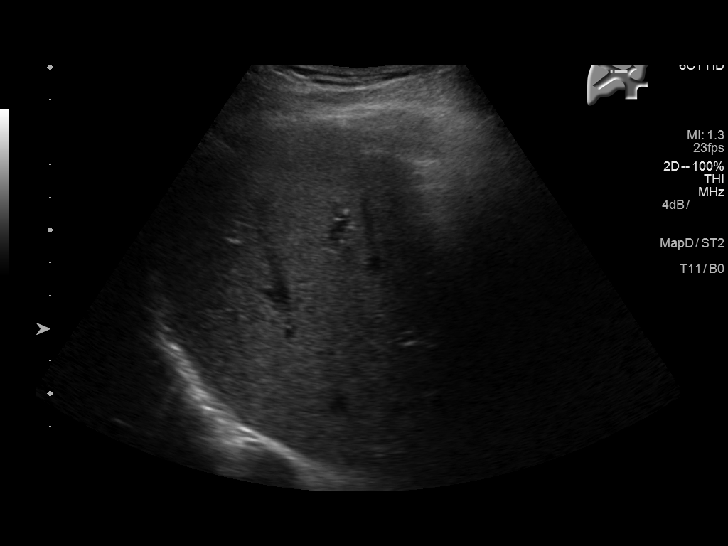
[im 44/87]
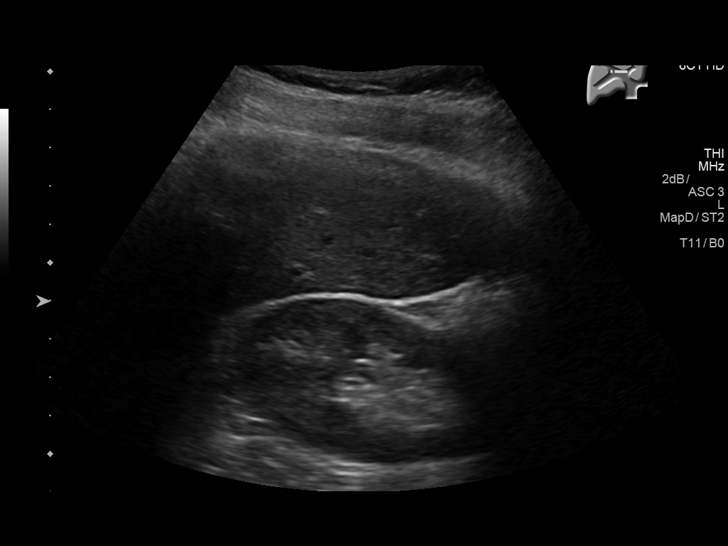
[im 51/87]
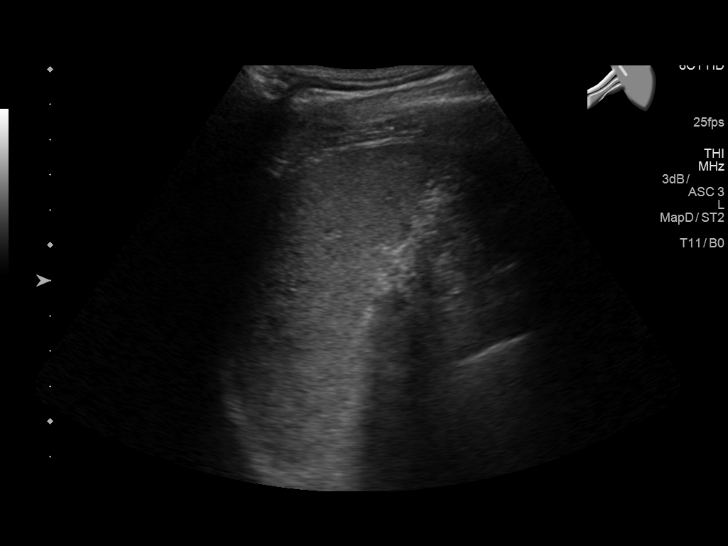
[im 58/87]
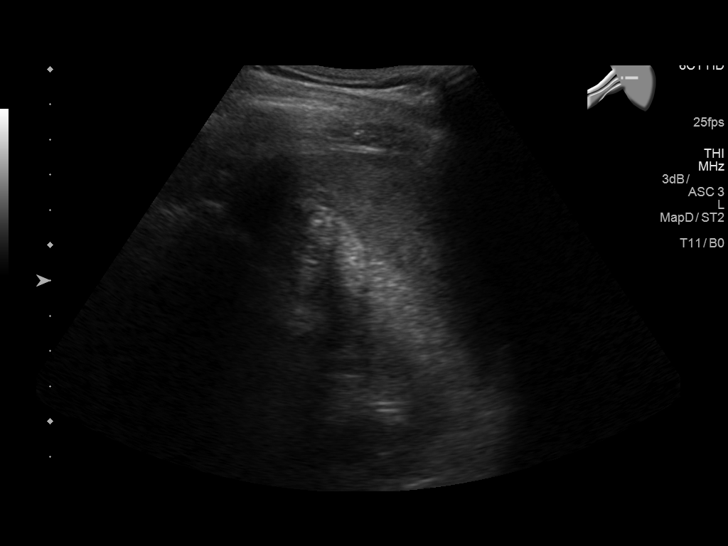
[im 65/87]
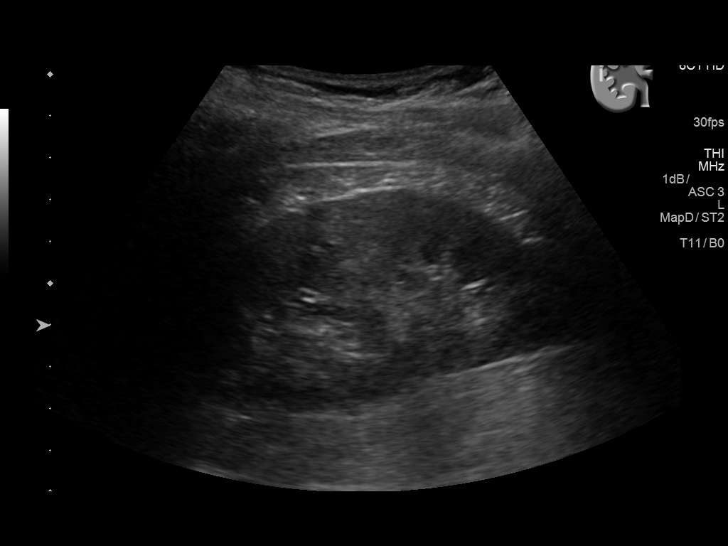
[im 72/87]
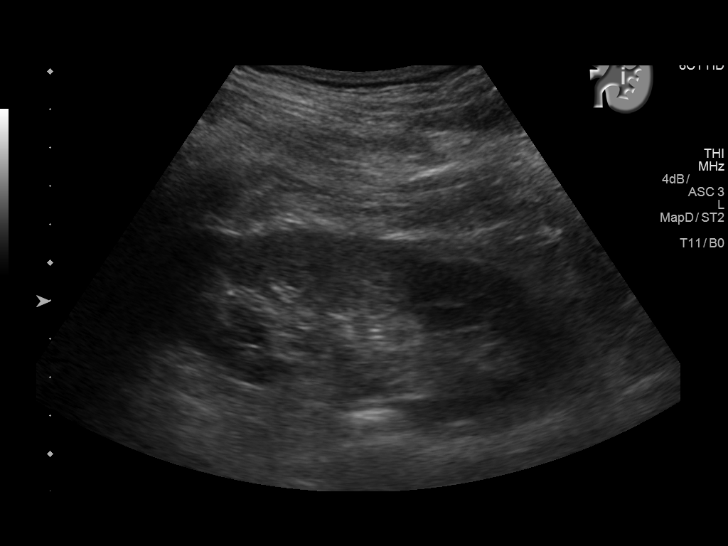
[im 79/87]
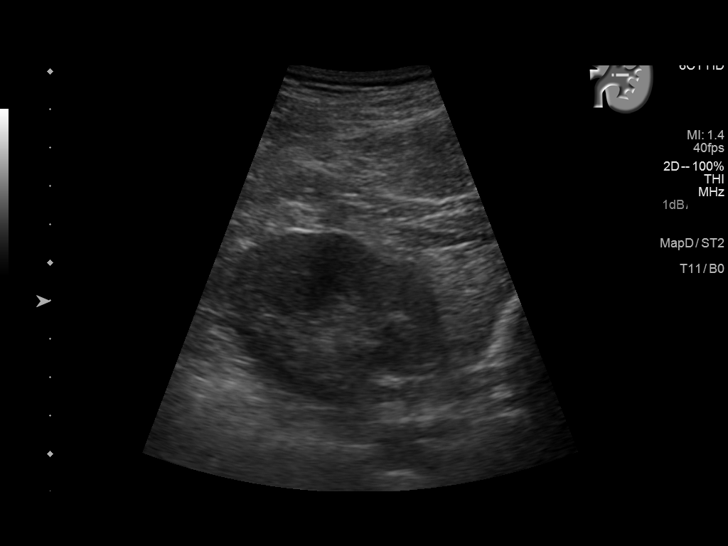
[im 87/87]
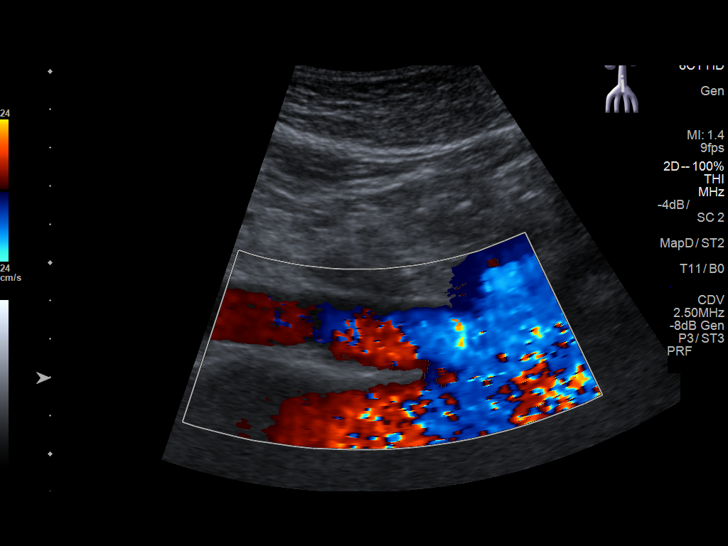

[13 of 25 positions shown; findings below may reference images not displayed]

FINDINGS: Gallbladder: No gallstones or wall thickening visualized. No
sonographic Murphy sign noted by sonographer. 3 mm polyp on the
nondependent wall has a benign appearance.

Common bile duct: Diameter: 1.7 mm

Liver: No focal lesion identified. Within normal limits in
parenchymal echogenicity.

IVC: No abnormality visualized.

Pancreas: Visualized portion unremarkable.

Spleen: Size and appearance within normal limits.

Right Kidney: Length: 10.9 cm. Echogenicity within normal limits. No
mass or hydronephrosis visualized.

Left Kidney: Length: 10.6 cm. Echogenicity within normal limits. No
mass or hydronephrosis visualized.

Abdominal aorta: Maximal diameter of the aorta is 2.5 cm

Other findings: None.
IMPRESSION: No acute intra- abdominal pathology.

Normal appearance of the liver

Benign 3 mm polyp in the gallbladder.

Maximal diameter of the abdominal aorta is 2.5 cm. Ectatic abdominal
aorta at risk for aneurysm development. Recommend followup by
ultrasound in 5 years. This recommendation follows ACR consensus
guidelines: White Paper of the ACR Incidental Findings Committee II

## 2018-07-14 ENCOUNTER — Ambulatory Visit: Payer: Self-pay

## 2019-09-25 ENCOUNTER — Emergency Department
Admission: EM | Admit: 2019-09-25 | Discharge: 2019-09-25 | Disposition: A | Discharge status: Home / Self Care | Payer: Self-pay | Attending: Emergency Medicine | Admitting: Emergency Medicine

## 2019-09-25 ENCOUNTER — Other Ambulatory Visit: Payer: Self-pay

## 2019-09-25 DIAGNOSIS — Z79899 Other long term (current) drug therapy: Secondary | ICD-10-CM | POA: Insufficient documentation

## 2019-09-25 DIAGNOSIS — L02411 Cutaneous abscess of right axilla: Secondary | ICD-10-CM | POA: Insufficient documentation

## 2019-09-25 DIAGNOSIS — F17210 Nicotine dependence, cigarettes, uncomplicated: Secondary | ICD-10-CM | POA: Insufficient documentation

## 2019-09-25 MED ORDER — NAPROXEN 500 MG PO TABS: 500.0000 mg | ORAL_TABLET | Freq: Two times a day (BID) | ORAL | Status: AC

## 2019-09-25 MED ORDER — TRAMADOL HCL 50 MG PO TABS: 50.0000 mg | ORAL_TABLET | Freq: Four times a day (QID) | ORAL | 0 refills | Status: AC | PRN

## 2019-09-25 MED ORDER — LIDOCAINE 5 % EX PTCH
1.0000 | MEDICATED_PATCH | CUTANEOUS | Status: DC
  Administered 2019-09-25: 1 via TRANSDERMAL
  Filled 2019-09-25: qty 1

## 2019-09-25 MED ORDER — SULFAMETHOXAZOLE-TRIMETHOPRIM 800-160 MG PO TABS: 1.0000 | ORAL_TABLET | Freq: Two times a day (BID) | ORAL | 0 refills | Status: AC

## 2019-09-25 NOTE — ED Notes (Signed)
See triage note  Presents with possible abscess under arm  States noticed area about 2 days ago   Afebrile  No drainage

## 2019-09-25 NOTE — Discharge Instructions (Signed)
Follow discharge care instruction take medication as directed. °

## 2019-09-25 NOTE — ED Triage Notes (Addendum)
Pt comes via POV from home with c/o possible abcess under left arm. Pt states he noticed it Saturday.  Pt denies any drainage. Pt states pain with movement.

## 2019-09-25 NOTE — ED Provider Notes (Signed)
[facility] Emergency Department Provider Note   ____________________________________________   First MD Initiated Contact with Patient 09/25/19 1427     (approximate)  I have reviewed the triage vital signs and the nursing notes.   HISTORY  Chief Complaint Abscess    HPI Tyrone Leon is a 49 y.o. male patient complain of pain to right axillary area secondary to a nodule lesion which she noted 2 days ago.  Patient denies fever discharge associated with complaint.  Patient rates pain as a 10/10.  Patient described pain as "achy".  No palliative measure for complaint.         Past Medical History:  Diagnosis Date  . PUD (peptic ulcer disease)     Patient Active Problem List   Diagnosis Date Noted  . Somatic dysfunction of cervical region 01/06/2017  . Somatic dysfunction of thoracic region 01/06/2017  . Muscle spasm 01/06/2017  . Somatic dysfunction of lumbar region 01/06/2017  . PUD (peptic ulcer disease)     Past Surgical History:  Procedure Laterality Date  . APPENDECTOMY    . UPPER GASTROINTESTINAL ENDOSCOPY      Prior to Admission medications   Medication Sig Start Date End Date Taking? Authorizing Provider  naproxen (NAPROSYN) 500 MG tablet Take 1 tablet (500 mg total) by mouth 2 (two) times daily with a meal. 09/25/19   Joni Reining, PA-C  pantoprazole (PROTONIX) 40 MG tablet Take 1 tablet (40 mg total) by mouth daily. 10/29/16   Doles-Johnson, Teah, NP  sulfamethoxazole-trimethoprim (BACTRIM DS) 800-160 MG tablet Take 1 tablet by mouth 2 (two) times daily. 09/25/19   Joni Reining, PA-C  traMADol (ULTRAM) 50 MG tablet Take 1 tablet (50 mg total) by mouth every 6 (six) hours as needed. 09/25/19 09/24/20  Joni Reining, PA-C    Allergies Patient has no allergy information on record.  Family History  Problem Relation Age of Onset  . Cancer - Ovarian Mother   . Cancer Father   . Multiple sclerosis Paternal Aunt   . Cancer  Maternal Grandmother   . Cancer Paternal Grandmother     Social History Social History   Tobacco Use  . Smoking status: Current Every Day Smoker    Packs/day: 1.00    Years: 25.00    Pack years: 25.00    Types: Cigarettes    Last attempt to quit: 2017    Years since quitting: 4.1  . Smokeless tobacco: Current User    Types: Chew  . Tobacco comment: quit cigarettes about 8 mo ago   Substance Use Topics  . Alcohol use: Yes    Alcohol/week: 3.0 - 4.0 standard drinks    Types: 3 - 4 Cans of beer per week    Comment: 3-4 beers per year  . Drug use: Yes    Types: Marijuana    Comment: 10 or 12 times per week     Review of Systems Constitutional: No fever/chills Eyes: No visual changes. ENT: No sore throat. Cardiovascular: Denies chest pain. Respiratory: Denies shortness of breath. Gastrointestinal: No abdominal pain.  No nausea, no vomiting.  No diarrhea.  No constipation. Genitourinary: Negative for dysuria. Musculoskeletal: Negative for back pain. Skin: Negative for rash.  Nodule lesion and redness right axillary area. Neurological: Negative for headaches, focal weakness or numbness.   ____________________________________________   PHYSICAL EXAM:  VITAL SIGNS: ED Triage Vitals  Enc Vitals Group     BP 09/25/19 1401 121/71     Pulse  Rate 09/25/19 1401 100     Resp 09/25/19 1401 18     Temp 09/25/19 1401 98.4 F (36.9 C)     Temp src --      SpO2 09/25/19 1401 100 %     Weight 09/25/19 1400 180 lb (81.6 kg)     Height 09/25/19 1400 5\' 10"  (1.778 m)     Head Circumference --      Peak Flow --      Pain Score 09/25/19 1400 10     Pain Loc --      Pain Edu? --      Excl. in [place]? --    Constitutional: Alert and oriented. Well appearing and in no acute distress. Cardiovascular: Normal rate, regular rhythm. Grossly normal heart sounds.  Good peripheral circulation. Respiratory: Normal respiratory effort.  No retractions. Lungs CTAB. Skin: Nonfluctuant lesion  right axillary area. Psychiatric: Mood and affect are normal. Speech and behavior are normal.  ____________________________________________   LABS (all labs ordered are listed, but only abnormal results are displayed)  Labs Reviewed - No data to display ____________________________________________  EKG   ____________________________________________  RADIOLOGY  ED MD interpretation:    Official radiology report(s): No results found.  ____________________________________________   PROCEDURES  Procedure(s) performed (including Critical Care):  Procedures   ____________________________________________   INITIAL IMPRESSION / ASSESSMENT AND PLAN / ED COURSE  As part of my medical decision making, I reviewed the following data within the [place]     Patient presents with pain and redness to the right axillary area.  Physical exam is consistent with a nonfluctuant abscess.  Discussed rationale for not incised and drained at this time.  Patient given discharge care instructions and a prescription for Bactrim DS, Tramadol, and naproxen.  Patient advised to ED if condition worsens.    Tyrone Leon was evaluated in Emergency Department on 09/25/2019 for the symptoms described in the history of present illness. He was evaluated in the context of the global COVID-19 pandemic, which necessitated consideration that the patient might be at risk for infection with the SARS-CoV-2 virus that causes COVID-19. Institutional protocols and algorithms that pertain to the evaluation of patients at risk for COVID-19 are in a state of rapid change based on information released by regulatory bodies including the CDC and federal and state organizations. These policies and algorithms were followed during the patient's care in the ED.       ____________________________________________   FINAL CLINICAL IMPRESSION(S) / ED DIAGNOSES  Final diagnoses:  Abscess of axilla,  right     ED Discharge Orders         Ordered    sulfamethoxazole-trimethoprim (BACTRIM DS) 800-160 MG tablet  2 times daily     09/25/19 1437    naproxen (NAPROSYN) 500 MG tablet  2 times daily with meals     09/25/19 1437    traMADol (ULTRAM) 50 MG tablet  Every 6 hours PRN     09/25/19 1437           Note:  This document was prepared using Dragon voice recognition software and may include unintentional dictation errors.    Sable Feil, PA-C 09/25/19 1442    Delman Kitten, MD 09/27/19 1705

## 2020-01-03 ENCOUNTER — Ambulatory Visit: Payer: Self-pay
